# Patient Record
Sex: Male | Born: 2000 | Race: Black or African American | Hispanic: No | Marital: Single | Smoking: Current every day smoker
Health system: Southern US, Community
[De-identification: ages and names within clinical notes are randomized; demographics above are authoritative.]

## PROBLEM LIST (undated history)

## (undated) DIAGNOSIS — R569 Unspecified convulsions: Secondary | ICD-10-CM

## (undated) HISTORY — PX: WRIST SURGERY: SHX841

---
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
## 2021-07-08 ENCOUNTER — Ambulatory Visit: Admission: EM | Admit: 2021-07-08 | Discharge: 2021-07-08 | Discharge status: Against Medical Advice | Payer: Self-pay

## 2021-07-08 ENCOUNTER — Ambulatory Visit: Payer: Self-pay

## 2021-11-12 ENCOUNTER — Emergency Department: Payer: Medicaid Other

## 2021-11-12 ENCOUNTER — Other Ambulatory Visit: Payer: Self-pay

## 2021-11-12 ENCOUNTER — Emergency Department
Admission: EM | Admit: 2021-11-12 | Discharge: 2021-11-12 | Disposition: A | Discharge status: Home / Self Care | Payer: Medicaid Other | Attending: Emergency Medicine | Admitting: Emergency Medicine

## 2021-11-12 DIAGNOSIS — R0789 Other chest pain: Secondary | ICD-10-CM | POA: Diagnosis not present

## 2021-11-12 LAB — COMPREHENSIVE METABOLIC PANEL
ALT: 15 U/L (ref 0–44)
AST: 19 U/L (ref 15–41)
Albumin: 4.5 g/dL (ref 3.5–5.0)
Alkaline Phosphatase: 56 U/L (ref 38–126)
Anion gap: 7 (ref 5–15)
BUN: 19 mg/dL (ref 6–20)
CO2: 26 mmol/L (ref 22–32)
Calcium: 9.4 mg/dL (ref 8.9–10.3)
Chloride: 104 mmol/L (ref 98–111)
Creatinine, Ser: 0.99 mg/dL (ref 0.61–1.24)
GFR, Estimated: >60 mL/min (ref >60)
Glucose, Bld: 81 mg/dL (ref 70–99)
Potassium: 3.8 mmol/L (ref 3.5–5.1)
Sodium: 137 mmol/L (ref 135–145)
Total Bilirubin: 1 mg/dL (ref 0.3–1.2)
Total Protein: 7.2 g/dL (ref 6.5–8.1)

## 2021-11-12 LAB — CBC WITH DIFFERENTIAL/PLATELET
Abs Immature Granulocytes: 0.02 10*3/uL (ref 0.00–0.07)
Basophils Absolute: 0 10*3/uL (ref 0.0–0.1)
Basophils Relative: 1 %
Eosinophils Absolute: 0.4 10*3/uL (ref 0.0–0.5)
Eosinophils Relative: 6 %
HCT: 42.4 % (ref 39.0–52.0)
Hemoglobin: 15.2 g/dL (ref 13.0–17.0)
Immature Granulocytes: 0 %
Lymphocytes Relative: 33 %
Lymphs Abs: 2 10*3/uL (ref 0.7–4.0)
MCH: 30.3 pg (ref 26.0–34.0)
MCHC: 35.8 g/dL (ref 30.0–36.0)
MCV: 84.6 fL (ref 80.0–100.0)
Monocytes Absolute: 0.6 10*3/uL (ref 0.1–1.0)
Monocytes Relative: 9 %
Neutro Abs: 3 10*3/uL (ref 1.7–7.7)
Neutrophils Relative %: 51 %
Platelets: 184 10*3/uL (ref 150–400)
RBC: 5.01 MIL/uL (ref 4.22–5.81)
RDW: 12.1 % (ref 11.5–15.5)
WBC: 6 10*3/uL (ref 4.0–10.5)
nRBC: 0 % (ref 0.0–0.2)

## 2021-11-12 LAB — TROPONIN I (HIGH SENSITIVITY): Troponin I (High Sensitivity): <2 ng/L (ref <18)

## 2021-11-12 NOTE — ED Triage Notes (Signed)
Pt presents to ER c/o right side chest pain/tightness.  Pt states every time he lays on his back, he has pain in the right side of his chest.  Pt states this has been going on for months, but has been getting worse recently.  Pt states he is a "heavy vaper, and I need to get my lungs checked out."  Pt is A&O x4 at this time in NAD in triage.

## 2021-11-12 NOTE — Discharge Instructions (Signed)
Follow-up with your regular doctor if not improving in 2 to 3 days.  Return emergency department worsening.  Take ibuprofen for pain as needed.

## 2021-11-12 NOTE — ED Provider Notes (Cosign Needed Addendum)
[facility] Provider Note    Event Date/Time   First MD Initiated Contact with Patient 11/12/21 [phone]     (approximate)   History   Chest Pain   HPI  Shawn Ballard is a 21 y.o. male with no significant past medical history presents emergency department for right-sided chest pain.  Patient states symptoms for several weeks.  States he vapes heavily.  No fever or chills.  No abdominal pain, no vomiting or diarrhea      Physical Exam   Triage Vital Signs: ED Triage Vitals  Enc Vitals Group     BP 11/12/21 0627 129/85     Pulse Rate 11/12/21 0627 73     Resp 11/12/21 0627 18     Temp 11/12/21 0627 98.1 F (36.7 C)     Temp Source 11/12/21 0627 Oral     SpO2 11/12/21 0627 100 %     Weight 11/12/21 0628 143 lb 12.8 oz (65.2 kg)     Height 11/12/21 0628 6' (1.829 m)     Head Circumference --      Peak Flow --      Pain Score 11/12/21 0645 8     Pain Loc --      Pain Edu? --      Excl. in GC? --     Most recent vital signs: Vitals:   11/12/21 0627 11/12/21 0807  BP: 129/85 129/86  Pulse: 73 72  Resp: 18 16  Temp: 98.1 F (36.7 C)   SpO2: 100% 100%     General: Awake, no distress.   CV:  Good peripheral perfusion. regular rate and  rhythm Resp:  Normal effort. Lungs CTA Abd:  No distention.  Nontender all 4 quads Other:      ED Results / Procedures / Treatments   Labs (all labs ordered are listed, but only abnormal results are displayed) Labs Reviewed  CBC WITH DIFFERENTIAL/PLATELET  COMPREHENSIVE METABOLIC PANEL  TROPONIN I (HIGH SENSITIVITY)  TROPONIN I (HIGH SENSITIVITY)     EKG  EKG   RADIOLOGY Chest x-ray    PROCEDURES:   Procedures   MEDICATIONS ORDERED IN ED: Medications - No data to display   IMPRESSION / MDM / ASSESSMENT AND PLAN / ED COURSE  I reviewed the triage vital signs and the nursing notes.                              Differential diagnosis includes, but is not limited to, PNA,  pneumothorax, MI, acute cholecystitis  Patient's presentation is most consistent with acute presentation with potential threat to life or bodily function.   EKG shows normal sinus rhythm, see physician read  Chest x-ray independently reviewed and interpreted by me as being negative for any acute abnormality  Lab work is pending  Labs are reassuring, CBC metabolic panel troponin are negative.  I do not feel we need second troponin as the pain has been ongoing for several weeks and appears to be more musculoskeletal.  I did explain all the findings to the patient.  He is in agreement treatment plan.  Discharged in stable condition.  Was encouraged to stop vaping and to take anti-inflammatories.     FINAL CLINICAL IMPRESSION(S) / ED DIAGNOSES   Final diagnoses:  Chest wall pain     Rx / DC Orders   ED Discharge Orders     None  Note:  This document was prepared using Dragon voice recognition software and may include unintentional dictation errors.    Faythe Ghee, PA-C 11/12/21 0851    Faythe Ghee, PA-C 11/12/21 4196    Concha Se, MD 11/13/21 [phone]

## 2021-11-12 NOTE — ED Notes (Signed)
Pt states he has a squeezing pressure in his right lung x several weeks. Pt denies cough, SOB, or issues with urination. Pt walking to room without distress. Pt states he sits a lot as he spends a lot of time gaming.

## 2022-04-02 ENCOUNTER — Emergency Department
Admission: EM | Admit: 2022-04-02 | Discharge: 2022-04-02 | Disposition: A | Discharge status: Home / Self Care | Payer: Medicaid Other | Attending: Emergency Medicine | Admitting: Emergency Medicine

## 2022-04-02 ENCOUNTER — Emergency Department: Payer: Medicaid Other

## 2022-04-02 ENCOUNTER — Other Ambulatory Visit: Payer: Self-pay

## 2022-04-02 DIAGNOSIS — M79672 Pain in left foot: Secondary | ICD-10-CM | POA: Insufficient documentation

## 2022-04-02 NOTE — ED Provider Notes (Signed)
[facility] Provider Note    Event Date/Time   First MD Initiated Contact with Patient 04/02/22 0249     (approximate)   History   Foot Pain (left)   HPI  Shawn Ballard is a 22 y.o. male with history of seizures who presents to the emergency department with left foot pain ongoing for several weeks.  No injury.  Able to ambulate.  No swelling, redness or warmth.  States he thinks it is from working 12-hour shifts at his job where he has to stand on concrete floors.  He has not seen a doctor for this.   History provided by patient.    Past Medical History:  Diagnosis Date   Seizures [facility])     Past Surgical History:  Procedure Laterality Date   WRIST SURGERY      MEDICATIONS:  Prior to Admission medications   Medication Sig Start Date End Date Taking? Authorizing Provider  divalproex (DEPAKOTE SPRINKLE) 125 MG capsule Take 500 mg by mouth 2 (two) times daily.  01/01/17   [provider]  ethosuximide (ZARONTIN) 250 MG/5ML solution Take 375 mg by mouth 2 (two) times daily.  01/18/17   [provider]  ibuprofen (ADVIL,MOTRIN) 800 MG tablet Take 1 tablet (800 mg total) by mouth 3 (three) times daily. 03/16/17   Louanne Skye, MD    Physical Exam   Triage Vital Signs: ED Triage Vitals [04/02/22 0224]  Enc Vitals Group     BP 128/77     Pulse Rate 70     Resp 16     Temp 97.9 F (36.6 C)     Temp Source Oral     SpO2 98 %     Weight      Height      Head Circumference      Peak Flow      Pain Score      Pain Loc      Pain Edu?      Excl. in [place]?     Most recent vital signs: Vitals:   04/02/22 0224  BP: 128/77  Pulse: 70  Resp: 16  Temp: 97.9 F (36.6 C)  SpO2: 98%     CONSTITUTIONAL: Alert and responds appropriately to questions. Well-appearing; well-nourished HEAD: Normocephalic, atraumatic EYES: Conjunctivae clear, pupils appear equal ENT: normal nose; moist mucous membranes NECK: Normal range of  motion CARD: Regular rate and rhythm RESP: Normal chest excursion without splinting or tachypnea; no hypoxia or respiratory distress, speaking full sentences ABD/GI: non-distended EXT: Normal ROM in all joints, no major deformities noted, no tenderness to the left foot, ankle.  No calf tenderness or calf swelling.  No soft tissue swelling.  No redness or increased warmth.  Normal capillary refill and 2+ left DP pulse.  No ligamentous laxity.  Compartments soft.  No joint effusions. SKIN: Normal color for age and race, no rashes on exposed skin NEURO: Moves all extremities equally, normal speech, no facial asymmetry noted PSYCH: The patient's mood and manner are appropriate. Grooming and personal hygiene are appropriate.  ED Results / Procedures / Treatments   LABS: (all labs ordered are listed, but only abnormal results are displayed) Labs Reviewed - No data to display   EKG:   RADIOLOGY: My personal review and interpretation of imaging: Left foot x-ray shows no acute abnormality.  I have personally reviewed all radiology reports. DG Foot Complete Left  Result Date: 04/02/2022 CLINICAL DATA:  Foot pain and swelling for  several weeks, initial encounter EXAM: LEFT FOOT - COMPLETE 3+ VIEW COMPARISON:  None Available. FINDINGS: There is no evidence of fracture or dislocation. There is no evidence of arthropathy or other focal bone abnormality. Soft tissues are unremarkable. IMPRESSION: No acute abnormality noted. Electronically Signed   By: Inez Catalina M.D.   On: 04/02/2022 02:49     PROCEDURES:  Critical Care performed: No     Procedures    IMPRESSION / MDM / ASSESSMENT AND PLAN / ED COURSE  I reviewed the triage vital signs and the nursing notes.   Patient here with several weeks of left foot pain without injury.     DIFFERENTIAL DIAGNOSIS (includes but not limited to):   Tendinitis, sprain, contusion, doubt fracture, septic arthritis, DVT, arterial obstruction,  cellulitis, gout, compartment syndrome  Patient's presentation is most consistent with acute, uncomplicated illness.  PLAN: X-ray obtained from triage which shows no acute abnormality when reviewed and interpreted by myself and the radiologist.  Offered pain medication here which he declines.  He is ambulatory.  No indication for splinting, Ace wrap today.  Recommended Tylenol, Motrin over-the-counter and follow-up with podiatry if symptoms continue.  Recommended good supportive shoes while at work.  Will provide with work note for couple of days that he can rest and hopefully get his pain better controlled.   MEDICATIONS GIVEN IN ED: Medications - No data to display   ED COURSE:  At this time, I do not feel there is any life-threatening condition present. I reviewed all nursing notes, vitals, pertinent previous records.  All lab and urine results, EKGs, imaging ordered have been independently reviewed and interpreted by myself.  I reviewed all available radiology reports from any imaging ordered this visit.  Based on my assessment, I feel the patient is safe to be discharged home without further emergent workup and can continue workup as an outpatient as needed. Discussed all findings, treatment plan as well as usual and customary return precautions.  They verbalize understanding and are comfortable with this plan.  Outpatient follow-up has been provided as needed.  All questions have been answered.    CONSULTS:  none   OUTSIDE RECORDS REVIEWED: Patient has seen podiatry before last time was in August 2020.     FINAL CLINICAL IMPRESSION(S) / ED DIAGNOSES   Final diagnoses:  Foot pain, left     Rx / DC Orders   ED Discharge Orders     None        Note:  This document was prepared using Dragon voice recognition software and may include unintentional dictation errors.   Crews Mccollam, Delice Bison, DO 04/02/22 [phone]

## 2022-04-02 NOTE — ED Notes (Signed)
Went over d/c paperwork at this time with patient. Pt had no questions, comments or concerns after review and verbally understood them.  

## 2022-04-02 NOTE — ED Triage Notes (Addendum)
Pt to ED from home for foot swelling and pain x 2-3 weeks. Pt is smiling and says "my family made me come in tonight". Pt has not seen MD for same. Pt is CAOx4 and in no acute distress. Pt ambulatory in triage. Pt denies any injury and no trauma or falls.

## 2022-04-02 NOTE — Discharge Instructions (Signed)
Your x-ray showed no fracture.  I recommend close follow-up with a podiatrist if you continue to have foot pain.  There is no sign of blood clots, obstruction of your arteries, infection on exam.  You may alternate Tylenol 1000 mg every 6 hours as needed for pain, fever and Ibuprofen 800 mg every 6-8 hours as needed for pain, fever.  Please take Ibuprofen with food.  Do not take more than 4000 mg of Tylenol (acetaminophen) in a 24 hour period.   Please go to the following website to schedule new (and existing) patient appointments:   [email or web address]   The following is a list of primary care offices in the area who are accepting new patients at this time.  Please reach out to one of them directly and let them know you would like to schedule an appointment to follow up on an Emergency Department visit, and/or to establish a new primary care provider (PCP).  There are likely other primary care clinics in the are who are accepting new patients, but this is an excellent place to start:  [place] physician: Dr Lavon Paganini [address] [place], [place] [address] [phone]  [facility] Lead Physician: Dr Steele Sizer [address], [place], [place] [address] [phone]  [place] Physician: Dr Park Liter [address] [place], [place] [address] [phone]  [facility] Lead Physician: Dr Dewaine Oats [address] [place], [place] [address] [phone]  [facility] [place] Physician: Dr Halina Maidens [address] [place], [place], [place] [address] [phone]

## 2022-09-04 ENCOUNTER — Ambulatory Visit: Payer: Self-pay | Admitting: Podiatry

## 2022-09-04 DIAGNOSIS — M778 Other enthesopathies, not elsewhere classified: Secondary | ICD-10-CM

## 2022-09-06 ENCOUNTER — Emergency Department: Payer: Self-pay

## 2022-09-06 ENCOUNTER — Emergency Department
Admission: EM | Admit: 2022-09-06 | Discharge: 2022-09-06 | Disposition: A | Discharge status: Home / Self Care | Payer: Self-pay | Attending: Emergency Medicine | Admitting: Emergency Medicine

## 2022-09-06 DIAGNOSIS — X58XXXA Exposure to other specified factors, initial encounter: Secondary | ICD-10-CM | POA: Insufficient documentation

## 2022-09-06 DIAGNOSIS — S43014A Anterior dislocation of right humerus, initial encounter: Secondary | ICD-10-CM | POA: Insufficient documentation

## 2022-09-06 MED ORDER — FENTANYL CITRATE PF 50 MCG/ML IJ SOSY
50.0000 ug | PREFILLED_SYRINGE | Freq: Once | INTRAMUSCULAR | Status: AC
  Administered 2022-09-06: 50 ug via INTRAVENOUS
  Filled 2022-09-06: qty 1

## 2022-09-06 MED ORDER — HYDROCODONE-ACETAMINOPHEN 5-325 MG PO TABS: 1.0000 | ORAL_TABLET | ORAL | 0 refills | Status: AC | PRN

## 2022-09-06 MED ORDER — PROPOFOL 10 MG/ML IV BOLUS
INTRAVENOUS | Status: AC
  Administered 2022-09-06: 64 mg via INTRAVENOUS
  Filled 2022-09-06: qty 20

## 2022-09-06 MED ORDER — PROPOFOL 10 MG/ML IV BOLUS: 1.0000 mg/kg | Freq: Once | INTRAVENOUS | Status: AC

## 2022-09-06 MED ORDER — PROPOFOL 10 MG/ML IV BOLUS
INTRAVENOUS | Status: AC | PRN
  Administered 2022-09-06: 45 mg via INTRAVENOUS

## 2022-09-06 MED ORDER — SODIUM CHLORIDE 0.9 % IV SOLN
INTRAVENOUS | Status: AC | PRN
  Administered 2022-09-06: 1000 mL via INTRAVENOUS

## 2022-09-06 NOTE — ED Triage Notes (Signed)
Pt states that he was sleeping and he sneezed and dislocated his R shoulder, hx of the same in the past.

## 2022-09-06 NOTE — ED Provider Notes (Addendum)
[facility] Provider Note   Event Date/Time   First MD Initiated Contact with Patient 09/06/22 2019     (approximate) History  Shoulder Pain  HPI Shawn Ballard is a 22 y.o. male with past medical history of right recurrent shoulder dislocation who presents today after waking up and sneezing prior to acute right shoulder pain.  Patient has deformity of her right shoulder and states this feels similar to previous dislocations that he has had in the past. ROS: Patient currently denies any vision changes, tinnitus, difficulty speaking, facial droop, sore throat, chest pain, shortness of breath, abdominal pain, nausea/vomiting/diarrhea, dysuria, or weakness/numbness/paresthesias in any extremity   Physical Exam  Triage Vital Signs: ED Triage Vitals [09/06/22 1927]  Enc Vitals Group     BP 137/75     Pulse Rate 74     Resp 20     Temp (!) 97.4 F (36.3 C)     Temp Source Oral     SpO2 100 %     Weight      Height      Head Circumference      Peak Flow      Pain Score 10     Pain Loc      Pain Edu?      Excl. in [place]?    Most recent vital signs: Vitals:   09/06/22 2104 09/06/22 2130  BP: 133/87 125/71  Pulse: 71 76  Resp: 15 18  Temp:    SpO2: 100% 100%   General: Awake, oriented x4. CV:  Good peripheral perfusion.  Resp:  Normal effort.  Abd:  No distention.  Other:  Young adult well-developed, well-nourished African-American male laying in stretcher in mild distress secondary to pain with obvious deformity over right shoulder ED Results / Procedures / Treatments  RADIOLOGY ED MD interpretation: X-ray of the right shoulder independently interpreted by me and shows an anterior dislocation of the right glenohumeral joint without any evidence of fracture -Agree with radiology assessment Official radiology report(s): DG Shoulder Right Portable  Result Date: 09/06/2022 CLINICAL DATA:  Postreduction. EXAM: RIGHT SHOULDER - 1 VIEW COMPARISON:   Earlier radiograph dated 09/06/2022. FINDINGS: Interval reduction of the previously seen dislocated right shoulder. The humeral head appears in anatomic alignment with the glenoid on this single provided image. IMPRESSION: Interval reduction of the previously seen dislocated right shoulder. Electronically Signed   By: Elgie Collard M.D.   On: 09/06/2022 21:09   DG Shoulder Right  Result Date: 09/06/2022 CLINICAL DATA:  Shoulder dislocation EXAM: RIGHT SHOULDER - 2+ VIEW COMPARISON:  12/20/2017 FINDINGS: There is anterior dislocation at the right glenohumeral joint. No visible fracture. Right lung is clear. IMPRESSION: Anterior dislocation at the right glenohumeral joint. Electronically Signed   By: Deatra Robinson M.D.   On: 09/06/2022 19:38   PROCEDURES: Critical Care performed: Yes, see critical care procedure note(s) .Ortho Injury Treatment  Date/Time: 09/06/2022 11:06 PM  Performed by: Merwyn Katos, MD Authorized by: Merwyn Katos, MD   Consent:    Consent obtained:  Written   Risks discussed:  Fracture, nerve damage, restricted joint movement, vascular damage and stiffness   Alternatives discussed:  No treatment, alternative treatment, immobilization, referral and delayed treatmentInjury location: shoulder Location details: right shoulder Injury type: dislocation Dislocation type: anterior Hill-Sachs deformity: no Chronicity: recurrent Pre-procedure neurovascular assessment: neurovascularly intact Pre-procedure distal perfusion: normal Pre-procedure neurological function: normal Pre-procedure range of motion: reduced  Anesthesia: Local anesthesia used: no  Patient  sedated: Yes. Refer to sedation procedure documentation for details of sedation. Manipulation performed: yes Reduction method: external rotation Reduction successful: yes X-ray confirmed reduction: yes Immobilization: sling Splint Applied by: ED Provider Supplies used: cotton padding Post-procedure  neurovascular assessment: post-procedure neurovascularly intact Post-procedure distal perfusion: normal Post-procedure neurological function: normal Post-procedure range of motion: normal   .Sedation  Date/Time: 10/04/2022 9:47 AM  Performed by: Merwyn Katos, MD Authorized by: Merwyn Katos, MD   Consent:    Consent obtained:  Written   Consent given by:  Patient   Risks discussed:  Allergic reaction, prolonged hypoxia resulting in organ damage, dysrhythmia, prolonged sedation necessitating reversal, inadequate sedation, respiratory compromise necessitating ventilatory assistance and intubation, nausea and vomiting   Alternatives discussed:  Analgesia without sedation, anxiolysis and regional anesthesia Universal protocol:    Immediately prior to procedure, a time out was called: yes   Indications:    Procedure necessitating sedation performed by:  Physician performing sedation Pre-sedation assessment:    Time since last food or drink:  Na   NPO status caution: urgency dictates proceeding with non-ideal NPO status     ASA classification: class 1 - normal, healthy patient     Mouth opening:  3 or more finger widths   Thyromental distance:  4 finger widths   Mallampati score:  I - soft palate, uvula, fauces, pillars visible   Neck mobility: normal     Pre-sedation assessments completed and reviewed: airway patency, cardiovascular function, hydration status, nausea/vomiting, pain level, respiratory function and temperature   Immediate pre-procedure details:    Reassessment: Patient reassessed immediately prior to procedure     Reviewed: vital signs, relevant labs/tests and NPO status     Verified: bag valve mask available, emergency equipment available, intubation equipment available, IV patency confirmed, oxygen available, reversal medications available and suction available   Procedure details (see MAR for exact dosages):    Preoxygenation:  Nasal cannula   Intra-procedure  monitoring:  Blood pressure monitoring, continuous capnometry, frequent LOC assessments, frequent vital sign checks, continuous pulse oximetry and cardiac monitor   Intra-procedure events: none     Total Provider sedation time (minutes):  23 Post-procedure details:    Attendance: Constant attendance by certified staff until patient recovered     Recovery: Patient returned to pre-procedure baseline     Post-sedation assessments completed and reviewed: airway patency, cardiovascular function, hydration status, mental status, nausea/vomiting, pain level, respiratory function and temperature     Patient is stable for discharge or admission: yes     Procedure completion:  Tolerated well, no immediate complications CRITICAL CARE Performed by: Merwyn Katos  Total critical care time: 35 minutes  Critical care time was exclusive of separately billable procedures and treating other patients.  Critical care was necessary to treat or prevent imminent or life-threatening deterioration.  Critical care was time spent personally by me on the following activities: development of treatment plan with patient and/or surrogate as well as nursing, discussions with consultants, evaluation of patient's response to treatment, examination of patient, obtaining history from patient or surrogate, ordering and performing treatments and interventions, ordering and review of laboratory studies, ordering and review of radiographic studies, pulse oximetry and re-evaluation of patient's condition.  MEDICATIONS ORDERED IN ED: Medications  propofol (DIPRIVAN) 10 mg/mL bolus/IV push 64 mg (64 mg Intravenous Given 09/06/22 2044)  fentaNYL (SUBLIMAZE) injection 50 mcg (50 mcg Intravenous Given 09/06/22 2032)  0.9 %  sodium chloride infusion (0 mLs Intravenous Stopped 09/06/22 2205)  propofol (DIPRIVAN) 10 mg/mL bolus/IV push (45 mg Intravenous Given 09/06/22 2046)   IMPRESSION / MDM / ASSESSMENT AND PLAN / ED COURSE  I reviewed  the triage vital signs and the nursing notes.                             The patient is on the cardiac monitor to evaluate for evidence of arrhythmia and/or significant heart rate changes. Patient's presentation is most consistent with acute presentation with potential threat to life or bodily function. Workup: XR Shoulder Findings: Dislocation  Patient does not currently demonstrate complications of dislocation such as compartment syndrome, arterial injury or nerve injury. The dislocation has been satisfactorily reduced and immobilized, and the patient has been given appropriate analgesia. Rx: sling immobilization 1 week, with gentle ROM to follow Disposition: Discharge with strict return precautions and instructions to follow up with primary MD within 48 hours for further evaluation including referral to an orthopedist.   FINAL CLINICAL IMPRESSION(S) / ED DIAGNOSES   Final diagnoses:  Anterior dislocation of right shoulder, initial encounter   Rx / DC Orders   ED Discharge Orders          Ordered    HYDROcodone-acetaminophen (NORCO) 5-325 MG tablet  Every 4 hours PRN        09/06/22 2125           Note:  This document was prepared using Dragon voice recognition software and may include unintentional dictation errors.   Merwyn Katos, MD 09/06/22 2312    Merwyn Katos, MD 10/04/22 [phone]

## 2023-01-14 ENCOUNTER — Emergency Department
Admission: EM | Admit: 2023-01-14 | Discharge: 2023-01-14 | Disposition: A | Discharge status: Home / Self Care | Payer: Self-pay | Attending: Emergency Medicine | Admitting: Emergency Medicine

## 2023-01-14 ENCOUNTER — Emergency Department: Payer: Self-pay

## 2023-01-14 ENCOUNTER — Other Ambulatory Visit: Payer: Self-pay

## 2023-01-14 DIAGNOSIS — X509XXA Other and unspecified overexertion or strenuous movements or postures, initial encounter: Secondary | ICD-10-CM | POA: Insufficient documentation

## 2023-01-14 DIAGNOSIS — S43014A Anterior dislocation of right humerus, initial encounter: Secondary | ICD-10-CM | POA: Insufficient documentation

## 2023-01-14 MED ORDER — ONDANSETRON HCL 4 MG/2ML IJ SOLN
4.0000 mg | Freq: Once | INTRAMUSCULAR | Status: AC
Start: 2023-01-14 — End: 2023-01-14
  Administered 2023-01-14: 4 mg via INTRAVENOUS
  Filled 2023-01-14: qty 2

## 2023-01-14 MED ORDER — HYDROMORPHONE HCL 1 MG/ML IJ SOLN
1.0000 mg | Freq: Once | INTRAMUSCULAR | Status: AC
  Administered 2023-01-14: 1 mg via INTRAVENOUS
  Filled 2023-01-14: qty 1

## 2023-01-14 NOTE — ED Notes (Signed)
Pt alert, NAD, calm, interactive, resps e/u, speaking in clear complete sentences. VS unremarkable. EDP in to see, at [facility]. Guarding R shoulder movements. CMS, and skin intact. ROM limited. Family at [facility]. Xray here for pt.

## 2023-01-14 NOTE — ED Provider Notes (Signed)
[facility] Provider Note    Event Date/Time   First MD Initiated Contact with Patient 01/14/23 1731     (approximate)   History   Shoulder Injury   HPI  Shawn Ballard is a 22 y.o. male with history of recurrent right shoulder dislocation who comes in with concern for right shoulder pain.  Patient reports that he was laying in bed when he sneezed and had his right shoulder pop out.  He states he has had this happen multiple times.  He denies any other injuries.   Physical Exam   Triage Vital Signs: ED Triage Vitals  Encounter Vitals Group     BP 01/14/23 1715 129/80     Systolic BP Percentile --      Diastolic BP Percentile --      Pulse Rate 01/14/23 1715 73     Resp 01/14/23 1715 17     Temp 01/14/23 1715 97.7 F (36.5 C)     Temp Source 01/14/23 1715 Oral     SpO2 01/14/23 1715 100 %     Weight --      Height --      Head Circumference --      Peak Flow --      Pain Score 01/14/23 1709 10     Pain Loc --      Pain Education --      Exclude from Growth Chart --     Most recent vital signs: Vitals:   01/14/23 1715  BP: 129/80  Pulse: 73  Resp: 17  Temp: 97.7 F (36.5 C)  SpO2: 100%     General: Awake, no distress.  CV:  Good peripheral perfusion.  Resp:  Normal effort.  Abd:  No distention.  Other:  Shoulder deformity noted to the right shoulder good distal pulse neuro intact.   ED Results / Procedures / Treatments   Labs (all labs ordered are listed, but only abnormal results are displayed) Labs Reviewed - No data to display   RADIOLOGY I have reviewed the xray personally and interpreted patient has a right shoulder dislocation.  PROCEDURES:  Critical Care performed: No  Reduction of dislocation  Date/Time: 01/14/2023 6:29 PM  Performed by: Concha Se, MD Authorized by: Concha Se, MD  Consent: Verbal consent obtained. Consent given by: patient Patient identity confirmed: verbally with  patient Local anesthesia used: no  Anesthesia: Local anesthesia used: no  Sedation: Patient sedated: no  Patient tolerance: patient tolerated the procedure well with no immediate complications      MEDICATIONS ORDERED IN ED: Medications - No data to display   IMPRESSION / MDM / ASSESSMENT AND PLAN / ED COURSE  I reviewed the triage vital signs and the nursing notes.   Patient's presentation is most consistent with acute presentation with potential threat to life or bodily function.   Patient reports right shoulder pain.  Differential is dislocation, fracture.  X-ray confirms dislocation upon my interpretation.  Patient was given 1 mg of Dilaudid and successfully able to easily reduce.  Repeat x-ray confirms.  We discussed shoulder precautions, following up with orthopedics and safe movements to do until follow-up.  Patient expressed understanding.  Post reduction he was neurovascularly intact   FINAL CLINICAL IMPRESSION(S) / ED DIAGNOSES   Final diagnoses:  Anterior dislocation of right shoulder, initial encounter     Rx / DC Orders   ED Discharge Orders     None  Note:  This document was prepared using Dragon voice recognition software and may include unintentional dictation errors.   Concha Se, MD 01/14/23 [phone]

## 2023-01-14 NOTE — ED Triage Notes (Signed)
Pt comes with c/o shoulder injury to right. Pt states he has had dislocation before several times.

## 2023-01-14 NOTE — ED Notes (Signed)
Shoulder reduced w/o sedation, and w/o complication.Tolerated well. CMS intact. Family present.

## 2023-01-14 NOTE — ED Notes (Signed)
R Shoulder placed in immobilizer. Pain decreased, "feels better". CMS intact. VSS. Pending post reduction film.

## 2023-01-14 NOTE — Discharge Instructions (Addendum)
Please call orthopedics to make a follow-up appointment to discuss your shoulder dislocations to see if you need further workup with MRI for why this is continuing to occur.  Return to the ER for recurrent dislocation otherwise keep your sling on until you follow-up with orthopedics  1. Interval reduction of a previously seen anterior dislocation of  the right glenohumeral joint.  2. Small Hill-Sachs deformity of the right humeral head. No bony  [place] fracture.

## 2023-05-03 ENCOUNTER — Ambulatory Visit: Payer: BC Managed Care – PPO | Admitting: Podiatry

## 2023-05-03 ENCOUNTER — Encounter: Payer: Self-pay | Admitting: Podiatry

## 2023-05-03 DIAGNOSIS — Q666 Other congenital valgus deformities of feet: Secondary | ICD-10-CM | POA: Diagnosis not present

## 2023-05-03 NOTE — Progress Notes (Signed)
  Subjective:  Patient ID: Shawn Ballard, male    DOB: January 22, 2001,  MRN: [id number]  Chief Complaint  Patient presents with   Foot Pain    "This has been going on for about 2 years.  I can't stand on my foot for long periods of time.  My whole foot hurts.  It is causing problems with my leg and my knees."    23 y.o. male presents with the above complaint.  Patient presents with bilateral flatfoot deformity that he states has been going for quite some time is getting more pain to the left side.  His bottom of the arch and heel hurts.  He has not seen anyone as prior to seeing me.  He denies any other acute complaints he states that it hurts when has been standing on his feet.   Review of Systems: Negative except as noted in the HPI. Denies N/V/F/Ch.  Past Medical History:  Diagnosis Date   Seizures (HCC)     Current Outpatient Medications:    divalproex (DEPAKOTE SPRINKLE) 125 MG capsule, Take 500 mg by mouth 2 (two) times daily.  (Patient not taking: Reported on 05/03/2023), Disp: , Rfl: 6   ethosuximide (ZARONTIN) 250 MG/5ML solution, Take 375 mg by mouth 2 (two) times daily.  (Patient not taking: Reported on 05/03/2023), Disp: , Rfl: 6   HYDROcodone-acetaminophen (NORCO) 5-325 MG tablet, Take 1 tablet by mouth every 4 (four) hours as needed for severe pain. (Patient not taking: Reported on 05/03/2023), Disp: 6 tablet, Rfl: 0   ibuprofen (ADVIL,MOTRIN) 800 MG tablet, Take 1 tablet (800 mg total) by mouth 3 (three) times daily. (Patient not taking: Reported on 05/03/2023), Disp: 21 tablet, Rfl: 0  Social History   Tobacco Use  Smoking Status Every Day   Types: E-cigarettes  Smokeless Tobacco Never    No Known Allergies Objective:  There were no vitals filed for this visit. There is no height or weight on file to calculate BMI. Constitutional Well developed. Well nourished.  Vascular Dorsalis pedis pulses palpable bilaterally. Posterior tibial pulses palpable  bilaterally. Capillary refill normal to all digits.  No cyanosis or clubbing noted. Pedal hair growth normal.  Neurologic Normal speech. Oriented to person, place, and time. Epicritic sensation to light touch grossly present bilaterally.  Dermatologic Nails well groomed and normal in appearance. No open wounds. No skin lesions.  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  His calcaneus does not return to neutral position on heel raise   Radiographs: None Assessment:  No diagnosis found. Plan:  Patient was evaluated and treated and all questions answered.  Pes planovalgus -I explained to patient the etiology of pes planovalgus and relationship with Planter fasciitis and various treatment options were discussed.  Given patient foot structure in the setting of Planter fasciitis I believe patient will benefit from custom-made orthotics to help control the hindfoot motion support the arch of the foot and take the stress away from plantar fascial.  Patient agrees with the plan like to proceed with orthotics -Patient was casted for orthotics  -  No follow-ups on file.

## 2023-05-31 ENCOUNTER — Telehealth: Payer: Self-pay

## 2023-05-31 NOTE — Telephone Encounter (Signed)
 [facility] IS NOT SET UP YET, TAKING ORTHOS TO [facility]. TO SCHEDULE ORTHOTIC PICK UP

## 2023-06-14 ENCOUNTER — Other Ambulatory Visit

## 2023-07-18 ENCOUNTER — Encounter (INDEPENDENT_AMBULATORY_CARE_PROVIDER_SITE_OTHER): Payer: Self-pay | Admitting: Nurse Practitioner

## 2023-10-18 ENCOUNTER — Telehealth: Payer: Self-pay | Admitting: Podiatry

## 2023-10-18 NOTE — Telephone Encounter (Signed)
 LVM to schedule orthotic fitting/ pu
